# Patient Record
Sex: Male | Born: 1980 | Race: White | Hispanic: No | Marital: Single | Smoking: Never smoker
Health system: Southern US, Community
[De-identification: ages and names within clinical notes are randomized; demographics above are authoritative.]

---

## 2001-03-06 ENCOUNTER — Encounter: Payer: Self-pay | Admitting: Preventative Medicine

## 2001-03-06 ENCOUNTER — Encounter: Admission: RE | Admit: 2001-03-06 | Discharge: 2001-03-06 | Payer: Self-pay | Admitting: Preventative Medicine

## 2001-07-18 ENCOUNTER — Emergency Department (HOSPITAL_COMMUNITY): Admission: EM | Admit: 2001-07-18 | Discharge: 2001-07-19 | Payer: Self-pay | Admitting: *Deleted

## 2001-07-19 ENCOUNTER — Encounter: Payer: Self-pay | Admitting: *Deleted

## 2014-07-27 ENCOUNTER — Emergency Department (HOSPITAL_COMMUNITY): Payer: 59

## 2014-07-27 ENCOUNTER — Emergency Department (HOSPITAL_COMMUNITY)
Admission: EM | Admit: 2014-07-27 | Discharge: 2014-07-27 | Disposition: A | Discharge status: Home / Self Care | Payer: 59 | Attending: Emergency Medicine | Admitting: Emergency Medicine

## 2014-07-27 ENCOUNTER — Encounter (HOSPITAL_COMMUNITY): Payer: Self-pay | Admitting: Emergency Medicine

## 2014-07-27 DIAGNOSIS — R109 Unspecified abdominal pain: Secondary | ICD-10-CM | POA: Diagnosis not present

## 2014-07-27 LAB — CBC WITH DIFFERENTIAL/PLATELET
Basophils Absolute: 0 10*3/uL (ref 0.0–0.1)
Basophils Relative: 1 % (ref 0–1)
Eosinophils Absolute: 0.1 10*3/uL (ref 0.0–0.7)
Eosinophils Relative: 2 % (ref 0–5)
HCT: 41.8 % (ref 39.0–52.0)
Hemoglobin: 14.1 g/dL (ref 13.0–17.0)
Lymphocytes Relative: 39 % (ref 12–46)
Lymphs Abs: 2.7 10*3/uL (ref 0.7–4.0)
MCH: 29.9 pg (ref 26.0–34.0)
MCHC: 33.7 g/dL (ref 30.0–36.0)
MCV: 88.6 fL (ref 78.0–100.0)
Monocytes Absolute: 0.6 10*3/uL (ref 0.1–1.0)
Monocytes Relative: 8 % (ref 3–12)
Neutro Abs: 3.4 10*3/uL (ref 1.7–7.7)
Neutrophils Relative %: 50 % (ref 43–77)
Platelets: 210 10*3/uL (ref 150–400)
RBC: 4.72 MIL/uL (ref 4.22–5.81)
RDW: 11.8 % (ref 11.5–15.5)
WBC: 6.8 10*3/uL (ref 4.0–10.5)

## 2014-07-27 LAB — COMPREHENSIVE METABOLIC PANEL
ALT: 22 U/L (ref 0–53)
AST: 23 U/L (ref 0–37)
Albumin: 4 g/dL (ref 3.5–5.2)
Alkaline Phosphatase: 50 U/L (ref 39–117)
Anion gap: 10 (ref 5–15)
BUN: 23 mg/dL (ref 6–23)
CO2: 27 mEq/L (ref 19–32)
Calcium: 9.1 mg/dL (ref 8.4–10.5)
Chloride: 102 mEq/L (ref 96–112)
Creatinine, Ser: 1.26 mg/dL (ref 0.50–1.35)
GFR calc Af Amer: 86 mL/min — ABNORMAL LOW (ref >90)
GFR calc non Af Amer: 74 mL/min — ABNORMAL LOW (ref >90)
Glucose, Bld: 92 mg/dL (ref 70–99)
Potassium: 4.5 mEq/L (ref 3.7–5.3)
Sodium: 139 mEq/L (ref 137–147)
Total Bilirubin: 0.5 mg/dL (ref 0.3–1.2)
Total Protein: 6.9 g/dL (ref 6.0–8.3)

## 2014-07-27 LAB — LIPASE, BLOOD: Lipase: 36 U/L (ref 11–59)

## 2014-07-27 LAB — URINALYSIS, ROUTINE W REFLEX MICROSCOPIC
Bilirubin Urine: NEGATIVE
Glucose, UA: NEGATIVE mg/dL
Hgb urine dipstick: NEGATIVE
Ketones, ur: NEGATIVE mg/dL
Leukocytes, UA: NEGATIVE
Nitrite: NEGATIVE
Protein, ur: NEGATIVE mg/dL
Specific Gravity, Urine: 1.008 (ref 1.005–1.030)
Urobilinogen, UA: 0.2 mg/dL (ref 0.0–1.0)
pH: 6.5 (ref 5.0–8.0)

## 2014-07-27 MED ORDER — ONDANSETRON HCL 4 MG/2ML IJ SOLN
4.0000 mg | Freq: Once | INTRAMUSCULAR | Status: AC
  Administered 2014-07-27: 4 mg via INTRAVENOUS
  Filled 2014-07-27: qty 2

## 2014-07-27 MED ORDER — OMEPRAZOLE 20 MG PO CPDR: 20.0000 mg | DELAYED_RELEASE_CAPSULE | Freq: Every day | ORAL | Status: AC

## 2014-07-27 MED ORDER — OXYCODONE-ACETAMINOPHEN 5-325 MG PO TABS: 1.0000 | ORAL_TABLET | Freq: Four times a day (QID) | ORAL | Status: AC | PRN

## 2014-07-27 MED ORDER — HYDROMORPHONE HCL 1 MG/ML IJ SOLN
1.0000 mg | Freq: Once | INTRAMUSCULAR | Status: AC
  Administered 2014-07-27: 1 mg via INTRAVENOUS
  Filled 2014-07-27: qty 1

## 2014-07-27 MED ORDER — SODIUM CHLORIDE 0.9 % IV BOLUS (SEPSIS)
1000.0000 mL | INTRAVENOUS | Status: AC
  Administered 2014-07-27: 1000 mL via INTRAVENOUS

## 2014-07-27 NOTE — Discharge Instructions (Signed)

## 2014-07-27 NOTE — ED Notes (Signed)
The pt has had rt  Flank pain since this past Tuesday.  No blood in urine.  No n v or diarrhea

## 2014-07-27 NOTE — ED Provider Notes (Signed)
CSN: [id number]     Arrival date & time 07/27/14  2027 History   First MD Initiated Contact with Patient 07/27/14 2111     Chief Complaint  Patient presents with  . Flank Pain     (Consider location/radiation/quality/duration/timing/severity/associated sxs/prior Treatment) Patient is a 33 y.o. male presenting with flank pain. The history is provided by the patient.  Flank Pain This is a new problem. Episode onset: 5 days ago. The problem occurs constantly. The problem has been gradually worsening. Associated symptoms include abdominal pain. Pertinent negatives include no chest pain, no headaches and no shortness of breath. Nothing aggravates the symptoms. Relieved by: motrin. Treatments tried: motrin. The treatment provided mild relief.    History reviewed. No pertinent past medical history. History reviewed. No pertinent past surgical history. No family history on file. History  Substance Use Topics  . Smoking status: Never Smoker   . Smokeless tobacco: Not on file  . Alcohol Use: No    Review of Systems  Constitutional: Negative for fever.  HENT: Negative for drooling and rhinorrhea.   Eyes: Negative for pain.  Respiratory: Negative for cough and shortness of breath.   Cardiovascular: Negative for chest pain and leg swelling.  Gastrointestinal: Positive for abdominal pain. Negative for nausea, vomiting and diarrhea.  Genitourinary: Positive for flank pain. Negative for dysuria and hematuria.  Musculoskeletal: Negative for gait problem and neck pain.  Skin: Negative for color change.  Neurological: Negative for numbness and headaches.  Hematological: Negative for adenopathy.  Psychiatric/Behavioral: Negative for behavioral problems.  All other systems reviewed and are negative.     Allergies  Review of patient's allergies indicates no known allergies.  Home Medications   Prior to Admission medications   Not on File   BP 112/71  Pulse 57  Temp(Src) 98.4 F (36.9  C) (Oral)  Resp 18  Ht  (1.905 m)  Wt 225 lb (102.059 kg)  BMI 28.12 kg/m2  SpO2 100% Physical Exam  Nursing note and vitals reviewed. Constitutional: He is oriented to person, place, and time. He appears well-developed and well-nourished.  HENT:  Head: Normocephalic and atraumatic.  Right Ear: External ear normal.  Left Ear: External ear normal.  Nose: Nose normal.  Mouth/Throat: Oropharynx is clear and moist. No oropharyngeal exudate.  Eyes: Conjunctivae and EOM are normal. Pupils are equal, round, and reactive to light.  Neck: Normal range of motion. Neck supple.  Cardiovascular: Normal rate, regular rhythm, normal heart sounds and intact distal pulses.  Exam reveals no gallop and no friction rub.   No murmur heard. Pulmonary/Chest: Effort normal and breath sounds normal. No respiratory distress. He has no wheezes.  Abdominal: Soft. Bowel sounds are normal. He exhibits no distension. There is no tenderness. There is no rebound and no guarding.  Musculoskeletal: Normal range of motion. He exhibits no edema and no tenderness.  Neurological: He is alert and oriented to person, place, and time.  Skin: Skin is warm and dry.  Psychiatric: He has a normal mood and affect. His behavior is normal.    ED Course  Procedures (including critical care time) Labs Review Labs Reviewed  COMPREHENSIVE METABOLIC PANEL - Abnormal; Notable for the following:    GFR calc non Af Amer 74 (*)    GFR calc Af Amer 86 (*)    All other components within normal limits  URINALYSIS, ROUTINE W REFLEX MICROSCOPIC  CBC WITH DIFFERENTIAL  LIPASE, BLOOD    Imaging Review Ct Renal Stone Study  07/27/2014  CLINICAL DATA:  Right flank pain since Tuesday.  Initial encounter.  EXAM: CT ABDOMEN AND PELVIS WITHOUT CONTRAST  TECHNIQUE: Multidetector CT imaging of the abdomen and pelvis was performed following the standard protocol without IV contrast.  COMPARISON:  None.  FINDINGS: BODY WALL: Unremarkable.   LOWER CHEST: Unremarkable.  ABDOMEN/PELVIS:  Liver: No focal abnormality.  Biliary: No evidence of biliary obstruction or stone.  Pancreas: Unremarkable.  Spleen: Unremarkable.  Adrenals: Unremarkable.  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 No hydronephrosis or ureteral calculus.  Bladder: Unremarkable.  Reproductive: Unremarkable.  Bowel: No obstruction. Subhepatic appendix, negative for appendicitis.  Retroperitoneum: No mass or adenopathy.  Peritoneum: No ascites or pneumoperitoneum.  Vascular: No acute abnormality.  OSSEOUS: No acute abnormalities.  IMPRESSION: No findings to explain right flank pain.   Electronically Signed   By: Tiburcio Pea M.D.   On: 07/27/2014 22:32     EKG Interpretation None      MDM   Final diagnoses:  Right flank pain    9:37 PM 33 y.o. male who presents with right flank pain which began gradually 5 days ago and has worsened since then. He denies nausea, vomiting, diarrhea, or fever. He states that his symptoms seem to be helped with food instead of worsened. His abdomen is benign and although he points to the right upper quadrant and right flank he has no focal tenderness. Will get screening labs and imaging. Dilaudid for pain control.  10:51 PM: I interpreted/reviewed the labs and/or imaging which were non-contributory.   I have discussed the diagnosis/risks/treatment options with the patient and believe the pt to be eligible for discharge home to follow-up with his pcp tomorrow. We also discussed returning to the ED immediately if new or worsening sx occur. We discussed the sx which are most concerning (e.g., worsening pain, fever, vomiting) that necessitate immediate return. Medications administered to the patient during their visit and any new prescriptions provided to the patient are listed below.  Medications given during this visit Medications  sodium chloride 0.9 % bolus 1,000 mL (not administered)   HYDROmorphone (DILAUDID) injection 1 mg (1 mg Intravenous Given 07/27/14 2201)  ondansetron (ZOFRAN) injection 4 mg (4 mg Intravenous Given 07/27/14 2200)    New Prescriptions   OMEPRAZOLE (PRILOSEC) 20 MG CAPSULE    Take 1 capsule (20 mg total) by mouth daily.   OXYCODONE-ACETAMINOPHEN (PERCOCET) 5-325 MG PER TABLET    Take 1-2 tablets by mouth every 6 (six) hours as needed for moderate pain.     Purvis Sheffield, MD 07/27/14 2316

## 2015-10-25 IMAGING — CT CT RENAL STONE PROTOCOL
1 of 5 series · 5 of 46 positions shown, 6 images · non-contrast
Comparison: None.

CLINICAL DATA: Right flank pain since [REDACTED].  Initial encounter.

EXAM:
CT ABDOMEN AND PELVIS WITHOUT CONTRAST
TECHNIQUE: Multidetector CT imaging of the abdomen and pelvis was performed
following the standard protocol without IV contrast.

[Series 206: coronals · coronal · 0.50mm/px · 5 of 93 slices shown, 6 images]
[im 11/93  soft-tissue]
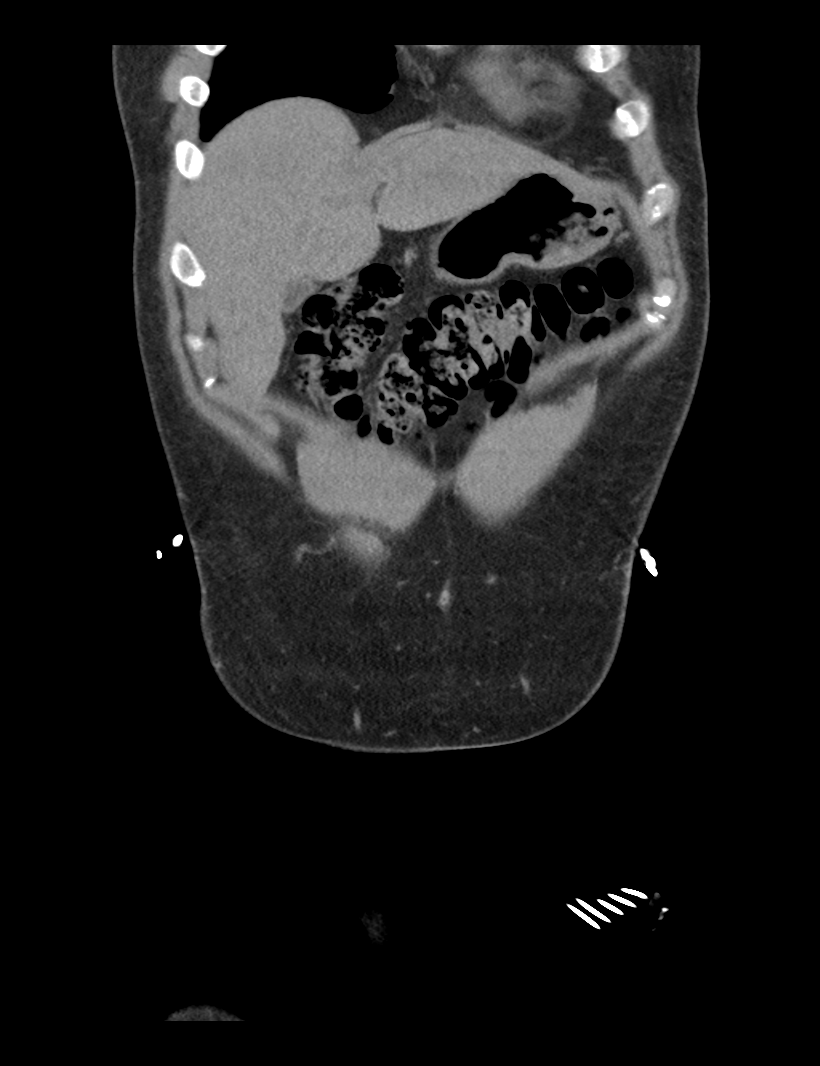
[im 11/93  bone]
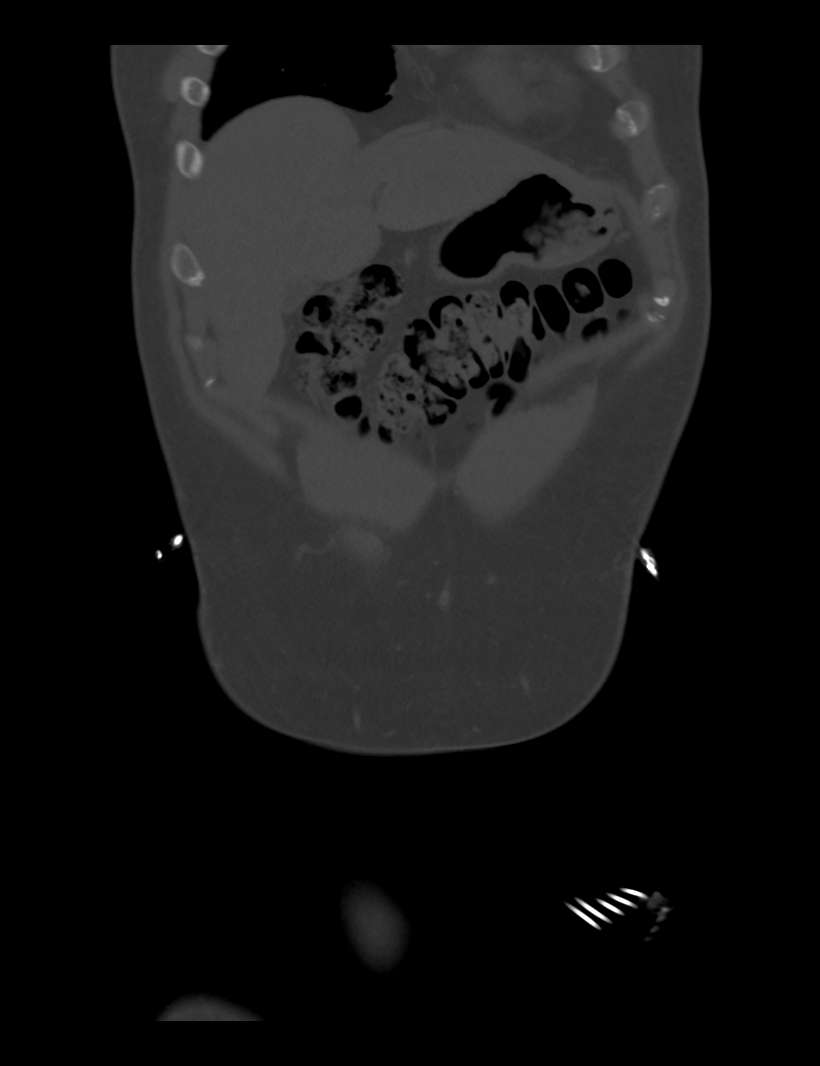
[im 31/93  soft-tissue]
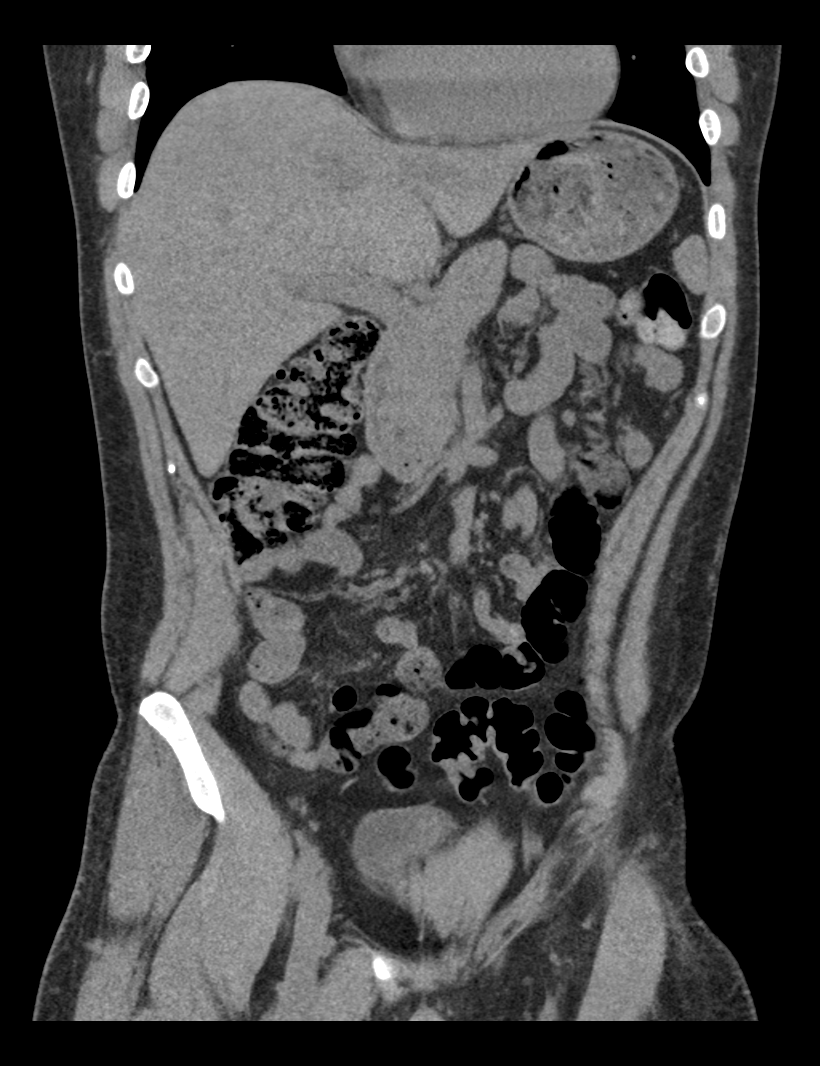
[im 52/93  soft-tissue]
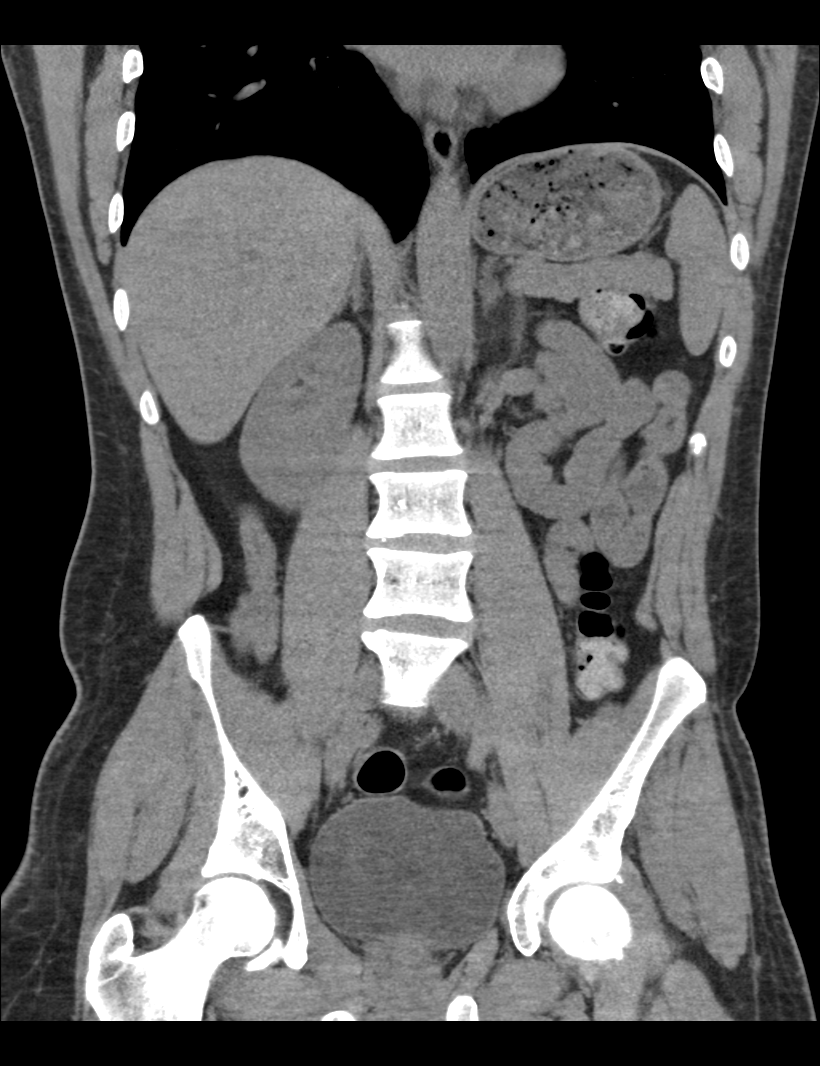
[im 62/93  soft-tissue]
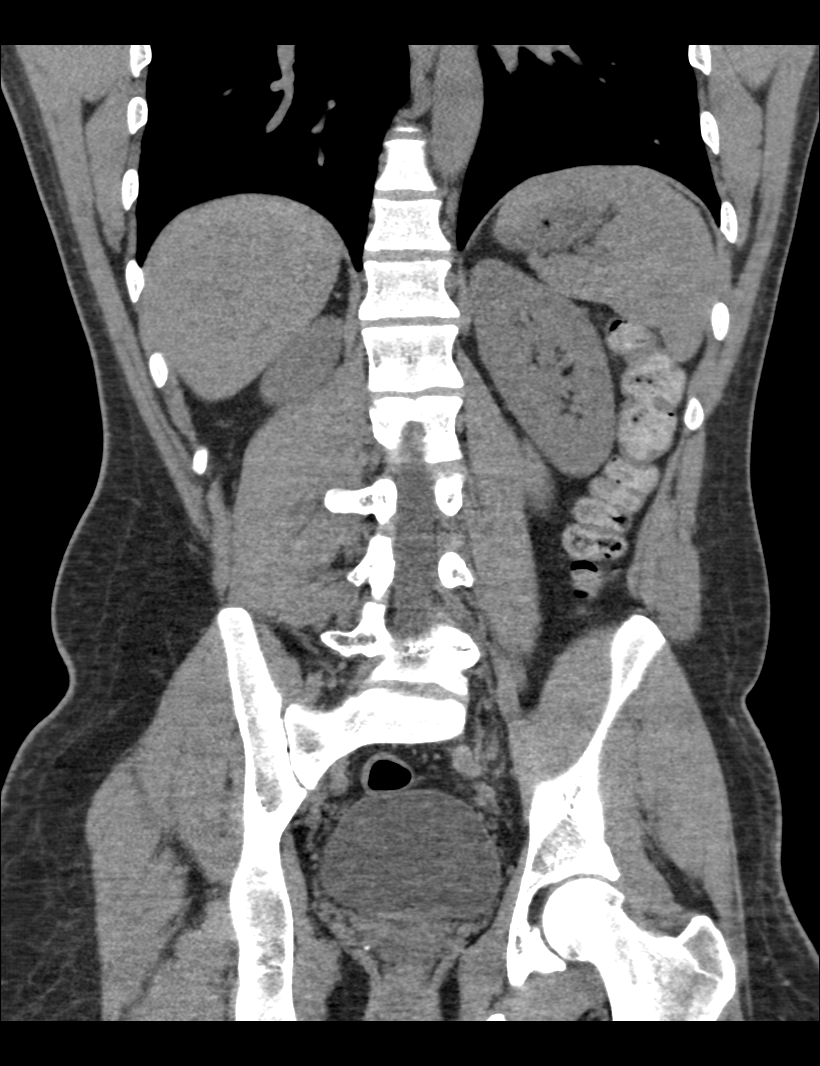
[im 82/93  soft-tissue]
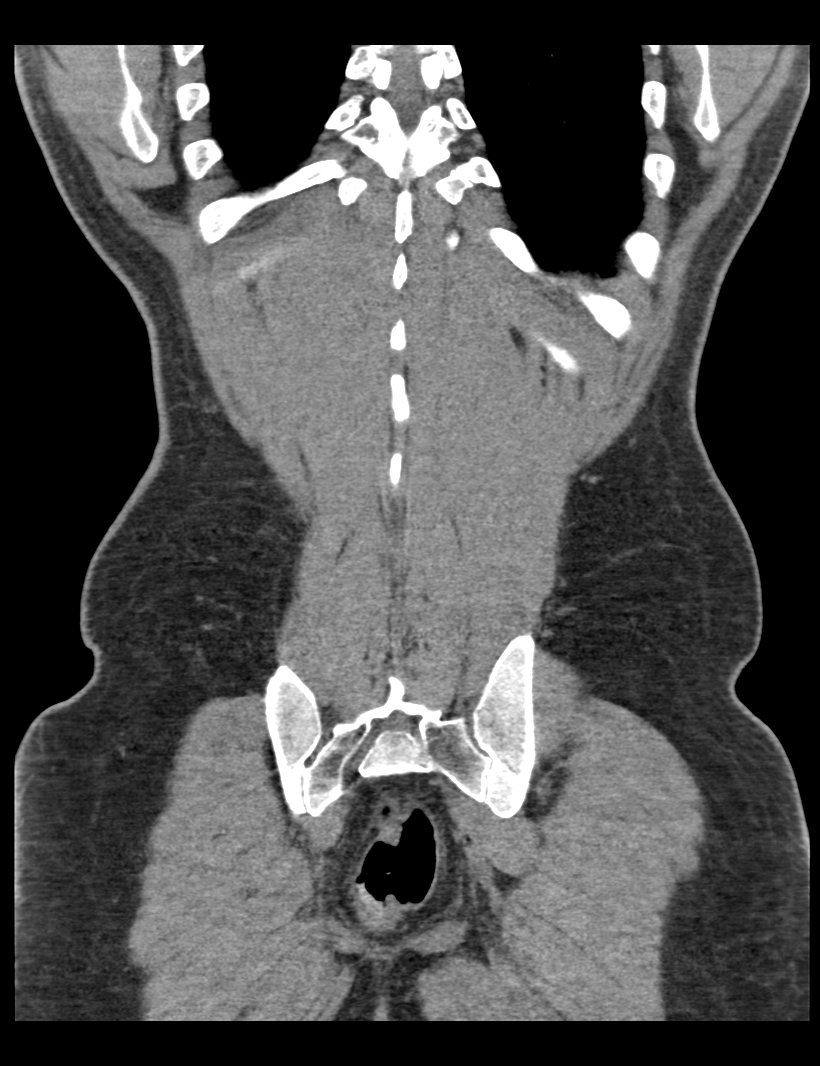

[5 of 46 positions shown; findings below may reference images not displayed]

FINDINGS: BODY WALL: Unremarkable.

LOWER CHEST: Unremarkable.

ABDOMEN/PELVIS:

Liver: No focal abnormality.

Biliary: No evidence of biliary obstruction or stone.

Pancreas: Unremarkable.

Spleen: Unremarkable.

Adrenals: Unremarkable.

Kidneys and ureters: Mild malrotation of the right kidney with low
accessory renal vasculature. No hydronephrosis or ureteral calculus.

Bladder: Unremarkable.

Reproductive: Unremarkable.

Bowel: No obstruction. Subhepatic appendix, negative for
appendicitis.

Retroperitoneum: No mass or adenopathy.

Peritoneum: No ascites or pneumoperitoneum.

Vascular: No acute abnormality.

OSSEOUS: No acute abnormalities.
IMPRESSION: No findings to explain right flank pain.

## 2022-05-21 ENCOUNTER — Emergency Department (HOSPITAL_COMMUNITY)
Admission: EM | Admit: 2022-05-21 | Discharge: 2022-05-22 | Disposition: A | Discharge status: Home / Self Care | Payer: Self-pay | Attending: Emergency Medicine | Admitting: Emergency Medicine

## 2022-05-21 ENCOUNTER — Emergency Department (HOSPITAL_COMMUNITY): Payer: Self-pay

## 2022-05-21 ENCOUNTER — Encounter (HOSPITAL_COMMUNITY): Payer: Self-pay

## 2022-05-21 DIAGNOSIS — R079 Chest pain, unspecified: Secondary | ICD-10-CM | POA: Insufficient documentation

## 2022-05-21 DIAGNOSIS — R519 Headache, unspecified: Secondary | ICD-10-CM | POA: Insufficient documentation

## 2022-05-21 DIAGNOSIS — R202 Paresthesia of skin: Secondary | ICD-10-CM | POA: Insufficient documentation

## 2022-05-21 LAB — CBC
HCT: 48.6 % (ref 39.0–52.0)
Hemoglobin: 16.5 g/dL (ref 13.0–17.0)
MCH: 31.1 pg (ref 26.0–34.0)
MCHC: 34 g/dL (ref 30.0–36.0)
MCV: 91.5 fL (ref 80.0–100.0)
Platelets: 269 10*3/uL (ref 150–400)
RBC: 5.31 MIL/uL (ref 4.22–5.81)
RDW: 11.9 % (ref 11.5–15.5)
WBC: 8.5 10*3/uL (ref 4.0–10.5)
nRBC: 0 % (ref 0.0–0.2)

## 2022-05-21 LAB — BASIC METABOLIC PANEL
Anion gap: 8 (ref 5–15)
BUN: 21 mg/dL — ABNORMAL HIGH (ref 6–20)
CO2: 24 mmol/L (ref 22–32)
Calcium: 10 mg/dL (ref 8.9–10.3)
Chloride: 109 mmol/L (ref 98–111)
Creatinine, Ser: 1.13 mg/dL (ref 0.61–1.24)
GFR, Estimated: >60 mL/min (ref >60)
Glucose, Bld: 102 mg/dL — ABNORMAL HIGH (ref 70–99)
Potassium: 4.2 mmol/L (ref 3.5–5.1)
Sodium: 141 mmol/L (ref 135–145)

## 2022-05-21 LAB — TROPONIN I (HIGH SENSITIVITY): Troponin I (High Sensitivity): <2 ng/L (ref <18)

## 2022-05-21 MED ORDER — PROCHLORPERAZINE MALEATE 10 MG PO TABS
10.0000 mg | ORAL_TABLET | Freq: Once | ORAL | Status: AC
  Administered 2022-05-21: 10 mg via ORAL
  Filled 2022-05-21: qty 1

## 2022-05-21 MED ORDER — SODIUM CHLORIDE 0.9 % IV BOLUS: 1000.0000 mL | Freq: Once | INTRAVENOUS | Status: DC

## 2022-05-21 MED ORDER — KETOROLAC TROMETHAMINE 15 MG/ML IJ SOLN: 15.0000 mg | Freq: Once | INTRAMUSCULAR | Status: DC

## 2022-05-21 MED ORDER — DIPHENHYDRAMINE HCL 50 MG/ML IJ SOLN: 25.0000 mg | Freq: Once | INTRAMUSCULAR | Status: DC

## 2022-05-21 MED ORDER — PROCHLORPERAZINE EDISYLATE 10 MG/2ML IJ SOLN: 10.0000 mg | Freq: Once | INTRAMUSCULAR | Status: DC

## 2022-05-21 MED ORDER — NAPROXEN 500 MG PO TABS
500.0000 mg | ORAL_TABLET | Freq: Once | ORAL | Status: AC
  Administered 2022-05-21: 500 mg via ORAL
  Filled 2022-05-21: qty 1

## 2022-05-21 NOTE — ED Provider Triage Note (Signed)
Emergency Medicine Provider Triage Evaluation Note  Wesley Allen , a 41 y.o. male  was evaluated in triage.  Pt complains of multiple complaints.  Patient states he had a episode of headache, left-sided diffuse body numbness on Monday.  The patient states on Tuesday he had a nosebleed.  Patient states on Wednesday he began experiencing chest pain that is continued into today.  The patient states that the chest pain is located centrally, does not radiate, is worse with exertion and is associated with shortness of breath.  Patient states that he has not seen a doctor in "since I was a little boy".  The patient states he has a family history of brain tumors on uncle side.  Patient reports that he has been having increasing headaches and migraines described as a pressure, debilitating for the last 3 years.  Review of Systems  Positive:  Negative:   Physical Exam  BP (!) 146/98   Pulse 98   Resp 18   Ht 6\' 3"  (1.905 m)   Wt 113.4 kg   SpO2 100%   BMI 31.25 kg/m  Gen:   Awake, no distress   Resp:  Normal effort  MSK:   Moves extremities without difficulty  Other:    Medical Decision Making  Medically screening exam initiated at 10:34 PM.  Appropriate orders placed.  Rosco Harriott was informed that the remainder of the evaluation will be completed by another provider, this initial triage assessment does not replace that evaluation, and the importance of remaining in the ED until their evaluation is complete.     Dub Mikes, PA-C 05/21/22 2236

## 2022-05-21 NOTE — ED Triage Notes (Signed)
Pt reports CP and left sided numbness to face, arm, and LE that started on Monday, nose bleed on Tuesday, also reports pressure to upper abd the past few days. CP and tightness started again today around lunch time while walking, also reports SOB. Reports "felt like ice going through my veins" on left arm. LLE edema noted. No cardiac hx. A&O x4, NAD noted at current.

## 2022-05-21 NOTE — ED Notes (Signed)
Patient transported to CT 

## 2022-05-22 MED ORDER — PROCHLORPERAZINE MALEATE 10 MG PO TABS: 10.0000 mg | ORAL_TABLET | Freq: Three times a day (TID) | ORAL | 0 refills | Status: AC | PRN

## 2022-05-22 NOTE — ED Provider Notes (Signed)
[facility]-EMERGENCY DEPT [facility] Emergency Department Provider Note MRN:  [id number]  Arrival date & time: 05/22/22     Chief Complaint   Headaches History of Present Illness   Wesley Allen is a 41 y.o. year-old male with no pertinent past medical history presenting to the ED with chief complaint of headaches.  Patient has been having headaches for the past year or more.  Seems to be happening more frequently, more constant.  Long history of migraines but the type of pain seems to be different.  More of an internal pressure rather than a tension.  Has been having some intermittent paresthesia to the left arm for the past few days.  Had some chest pressure earlier today as well.  No nausea vomiting, no dizziness or diaphoresis, no shortness of breath, no abdominal pain, no numbness or weakness to the arms or legs.  Patient explains that when he feels strong emotions, rather than expressing emotions he tends to get headaches.  Mother passed away about a year ago.  Review of Systems  A thorough review of systems was obtained and all systems are negative except as noted in the HPI and PMH.   Patient's Health History   History reviewed. No pertinent past medical history.  History reviewed. No pertinent surgical history.  No family history on file.  Social History   Socioeconomic History   Marital status: Married    Spouse name: Not on file   Number of children: Not on file   Years of education: Not on file   Highest education level: Not on file  Occupational History   Not on file  Tobacco Use   Smoking status: Never   Smokeless tobacco: Not on file  Vaping Use   Vaping Use: Never used  Substance and Sexual Activity   Alcohol use: No   Drug use: Never   Sexual activity: Not Currently  Other Topics Concern   Not on file  Social History Narrative   Not on file   Social Determinants of Health   Financial Resource Strain: Not on file  Food Insecurity: Not on file   Transportation Needs: Not on file  Physical Activity: Not on file  Stress: Not on file  Social Connections: Not on file  Intimate Partner Violence: Not on file     Physical Exam   Vitals:   05/21/22 2210 05/21/22 2345  BP: (!) 146/98 (!) 153/90  Pulse: 98 74  Resp: 18 16  SpO2: 100% 96%    CONSTITUTIONAL: Well-appearing, NAD NEURO/PSYCH:  Alert and oriented x 3, normal and symmetric strength and sensation, normal coordination, normal speech EYES:  eyes equal and reactive ENT/NECK:  no LAD, no JVD CARDIO: Regular rate, well-perfused, normal S1 and S2 PULM:  CTAB no wheezing or rhonchi GI/GU:  non-distended, non-tender MSK/SPINE:  No gross deformities, no edema SKIN:  no rash, atraumatic   *Additional and/or pertinent findings included in MDM below  Diagnostic and Interventional Summary    EKG Interpretation  Date/Time:  Saturday May 21 2022 22:45:28 EDT Ventricular Rate:  79 PR Interval:  183 QRS Duration: 92 QT Interval:  364 QTC Calculation: 418 R Axis:   -7 Text Interpretation: Sinus rhythm Abnormal R-wave progression, early transition No previous ECGs available Confirmed by Kennis Carina [phone]) on 05/21/2022 11:03:05 PM       Labs Reviewed  BASIC METABOLIC PANEL - Abnormal; Notable for the following components:      Result Value   Glucose, Bld 102 (*)  BUN 21 (*)    All other components within normal limits  CBC  TROPONIN I (HIGH SENSITIVITY)  TROPONIN I (HIGH SENSITIVITY)    DG Chest 2 View  Final Result    CT Head Wo Contrast  Final Result      Medications  prochlorperazine (COMPAZINE) tablet 10 mg (10 mg Oral Given 05/21/22 2358)  naproxen (NAPROSYN) tablet 500 mg (500 mg Oral Given 05/21/22 2357)     Procedures  /  Critical Care Procedures  ED Course and Medical Decision Making  Initial Impression and Ddx Patient here mostly to discuss his worsening headaches.  With the new paresthesias over the past several days there is suspicion for  complex migraines.  Other considerations would be mass occupying lesion of the brain.  Very reassuring neurological exam, highly doubt stroke or other vascular process.  The chest pain was fleeting in nature, mild, he does not have strokelike symptoms on exam, dissection is felt to be extremely unlikely.  Vital signs normal, EKG reassuring, awaiting labs, CT head.  Past medical/surgical history that increases complexity of ED encounter: None  Interpretation of Diagnostics I personally reviewed the EKG and my interpretation is as follows: Sinus rhythm  Labs reassuring with no significant blood count or electrolyte disturbance, troponin is negative.  CT head is normal.  Patient Reassessment and Ultimate Disposition/Management     Patient provided with reassurance, nothing to suggest emergent process at this time, referred to neurology.  Patient management required discussion with the following services or consulting groups:  None  Complexity of Problems Addressed Acute illness or injury that poses threat of life of bodily function  Additional Data Reviewed and Analyzed Further history obtained from: Further history from spouse/family member  Additional Factors Impacting ED Encounter Risk Prescriptions  Elmer Sow. Pilar Plate, MD [facility] Emergency Medicine [facility] [email or web address]  Final Clinical Impressions(s) / ED Diagnoses     ICD-10-CM   1. Headache disorder  R51.9     2. Chest pain, unspecified type  R07.9     3. Paresthesia  R20.2       ED Discharge Orders          Ordered    Ambulatory referral to Neurology       Comments: An appointment is requested in approximately: 1 week   05/22/22 0022    prochlorperazine (COMPAZINE) 10 MG tablet  Every 8 hours PRN        05/22/22 0023             Discharge Instructions Discussed with and Provided to Patient:    Discharge Instructions      You were evaluated in the Emergency Department and  after careful evaluation, we did not find any emergent condition requiring admission or further testing in the hospital.  Your exam/testing today was overall reassuring.  Symptoms seem to be related to a headache disorder.  Your CT scan did not show any brain abnormalities, your labs were normal.  Recommend follow-up with a neurologist to further discuss your headaches.  Also recommend follow-up with a primary care doctor.  Recommend using [email or web address] to find the earliest primary care appointment.  Can use the Compazine as needed for headaches.  Please return to the Emergency Department if you experience any worsening of your condition.  Thank you for allowing [place] to be a part of your care.       Sabas Sous, MD 05/22/22 0030

## 2022-05-22 NOTE — Discharge Instructions (Addendum)
You were evaluated in the Emergency Department and after careful evaluation, we did not find any emergent condition requiring admission or further testing in the hospital.  Your exam/testing today was overall reassuring.  Symptoms seem to be related to a headache disorder.  Your CT scan did not show any brain abnormalities, your labs were normal.  Recommend follow-up with a neurologist to further discuss your headaches.  Also recommend follow-up with a primary care doctor.  Recommend using [email or web address] to find the earliest primary care appointment.  Can use the Compazine as needed for headaches.  Please return to the Emergency Department if you experience any worsening of your condition.  Thank you for allowing [place] to be a part of your care.

## 2022-05-22 NOTE — ED Notes (Signed)
Patient reports improvement after medications.

## 2022-06-02 ENCOUNTER — Telehealth: Payer: Self-pay | Admitting: Psychiatry

## 2022-06-02 ENCOUNTER — Encounter: Payer: Self-pay | Admitting: Psychiatry

## 2022-06-02 ENCOUNTER — Ambulatory Visit (INDEPENDENT_AMBULATORY_CARE_PROVIDER_SITE_OTHER): Payer: Self-pay | Admitting: Psychiatry

## 2022-06-02 VITALS — BP 128/80 | HR 91 | Ht 75.5 in | Wt 275.4 lb

## 2022-06-02 DIAGNOSIS — R29818 Other symptoms and signs involving the nervous system: Secondary | ICD-10-CM

## 2022-06-02 DIAGNOSIS — G43109 Migraine with aura, not intractable, without status migrainosus: Secondary | ICD-10-CM

## 2022-06-02 DIAGNOSIS — R519 Headache, unspecified: Secondary | ICD-10-CM

## 2022-06-02 NOTE — Telephone Encounter (Signed)
Correction-MRI was sent to [facility], patient requested a [facility] facility.

## 2022-06-02 NOTE — Patient Instructions (Addendum)
Recommend brain MRI with contrast  [facility] [employer] [facility] at [phone]   Migraine Aura Migraines are moderate to severe headaches which may have throbbing pain, sensitivity to light/sound, and nausea. They typically last 3 hours or more at a time.  Migraines can be preceded by neurological symptoms called auras. Auras can have many different symptoms including vision changes (zigzags, kaleidoscope vision, flashing lights, blurred vision), numbness and tingling (usually on the arm or face), difficulty with language (reading or speaking), and weakness on one side. Many times these symptoms will occur before a headache, but auras can occur without a headache as well. Auras can naturally change over time and may convert from one type to another (people with visual aura may eventually develop a sensory aura, etc).   Natural supplements that can reduce migraines: Magnesium Oxide or Magnesium Glycinate 500 mg at bed (up to 800 mg daily) Coenzyme Q10 300 mg in AM Vitamin B2- 200 mg twice a day  Add 1 supplement at a time since even natural supplements can have undesirable side effects. You can sometimes buy supplements cheaper (especially Coenzyme Q10) at www.[employer].co.za or at [employer].  Magnesium: Magnesium (250 mg twice a day or 500 mg at bed) has a relaxant effect on smooth muscles such as blood vessels. Individuals suffering from frequent or daily headache usually have low magnesium levels which can be increase with daily supplementation of 400-800 mg. Three trials found 40-90% average headache reduction  when used as a preventative. Magnesium also demonstrated the benefit in menstrually related migraine.  Magnesium is part of the messenger system in the serotonin cascade and it is a good muscle relaxant.  It is also useful for constipation. Good sources include nuts, whole grains, and tomatoes. Side Effects: loose stool/diarrhea  Riboflavin (vitamin B 2) 200 mg twice a day. This  vitamin assists nerve cells in the production of ATP a principal energy storing molecule.  It is necessary for many chemical reactions in the body.  There have been at least 3 clinical trials of riboflavin using 400 mg per day all of which suggested that migraine frequency can be decreased.  All 3 trials showed significant improvement in over half of migraine sufferers.  The supplement is found in bread, cereal, milk, meat, and poultry.  Most Americans get more riboflavin than the recommended daily allowance, however riboflavin deficiency is not necessary for the supplements to help prevent headache. Side effects: energizing, green urine  Coenzyme Q10: This is present in almost all cells in the body and is critical component for the conversion of energy.  Recent studies have shown that a nutritional supplement of CoQ10 can reduce the frequency of migraine attacks by improving the energy production of cells as with riboflavin.  Doses of 150 mg twice a day have been shown to be effective.

## 2022-06-02 NOTE — Telephone Encounter (Signed)
Self-pay sent to [facility]

## 2022-06-02 NOTE — Progress Notes (Signed)
[facility]  PATIENT: Wesley Allen DOB: [birth year]  REFERRING CLINICIAN: Sabas Sous, MD HISTORY FROM: self REASON FOR VISIT: headache, numbness   HISTORICAL  CHIEF COMPLAINT:  Chief Complaint  Patient presents with   New Patient (Initial Visit)    Pt is well. He states he has been feeling numbness in head Monday-Tuesday. He reports feeling pressure in hs head as well. He states he also has been feeling numbness on whole left side. Room 8 alone    HISTORY OF PRESENT ILLNESS:  The patient presents for evaluation of headaches and numbness. He has a history of migraines but started to develop frequent headaches over the past 2 years. They have been worsening in intensity and frequency. Feels like he has constant pressure starting from the center of his head pressing outward. He will also have pain behind his eyes or in the back of his head. Pain can be dull or throbbing. Severity will fluctuate. He started to get tingling and an electrical feeling over his vertex. Feels like bubbles are moving around his head or he will have a cold sensation over his face. Headaches are not associated with photophobia, phonophobia, or nausea. Sometimes will get tunnel vision. They are severe enough that they are debilitating and will drain him of energy. Severe headaches can last up to 3 hours at a time. No clear triggers for his headaches though he has a lot of stress in his life.  Thought this may be psychological so he went to behavioral health for a year, but this did not improve his headache. Notes he did lose his mother a year ago.  Earlier this month  he woke up and felt decreased sensation in his left arm which then spread to his left leg. Numbness gradually subsided. He did not have any symptoms for the next few days, then was hiking in the heat and developed shortness of breath and chest tightness. Then his left arm went numb for a second time. He presented to the ED 05/21/22  where cardiac workup and CTH were unremarkable.  He did have another episode of numbness 3 days ago. This time felt like his entire head was going numb. He developed a headache after the numbness wore off.   OTHER MEDICAL CONDITIONS: migraines   REVIEW OF SYSTEMS: Full 14 system review of systems performed and negative with exception of: headaches, numbness  ALLERGIES: No Known Allergies  HOME MEDICATIONS: Outpatient Medications Prior to Visit  Medication Sig Dispense Refill   ibuprofen (ADVIL,MOTRIN) 200 MG tablet Take 800 mg by mouth every 6 (six) hours as needed for moderate pain. (Patient not taking: Reported on 06/02/2022)     omeprazole (PRILOSEC) 20 MG capsule Take 1 capsule (20 mg total) by mouth daily. (Patient not taking: Reported on 05/21/2022) 30 capsule 0   oxyCODONE-acetaminophen (PERCOCET) 5-325 MG per tablet Take 1-2 tablets by mouth every 6 (six) hours as needed for moderate pain. (Patient not taking: Reported on 05/21/2022) 20 tablet 0   prochlorperazine (COMPAZINE) 10 MG tablet Take 1 tablet (10 mg total) by mouth every 8 (eight) hours as needed (Nausea or headache). (Patient not taking: Reported on 06/02/2022) 20 tablet 0   No facility-administered medications prior to visit.    PAST MEDICAL HISTORY: History reviewed. No pertinent past medical history.  PAST SURGICAL HISTORY: History reviewed. No pertinent surgical history.  FAMILY HISTORY: Paternal uncle had a brain tumor.  SOCIAL HISTORY: Social History   Socioeconomic History   Marital status:  Single    Spouse name: Not on file   Number of children: Not on file   Years of education: Not on file   Highest education level: Not on file  Occupational History   Not on file  Tobacco Use   Smoking status: Never   Smokeless tobacco: Not on file  Vaping Use   Vaping Use: Never used  Substance and Sexual Activity   Alcohol use: No   Drug use: Never   Sexual activity: Not Currently  Other Topics Concern    Not on file  Social History Narrative   Not on file   Social Determinants of Health   Financial Resource Strain: Not on file  Food Insecurity: Not on file  Transportation Needs: Not on file  Physical Activity: Not on file  Stress: Not on file  Social Connections: Not on file  Intimate Partner Violence: Not on file     PHYSICAL EXAM   GENERAL EXAM/CONSTITUTIONAL: Vitals:  Vitals:   06/02/22 0914  BP: 128/80  Pulse: 91  Weight: 275 lb 6 oz (124.9 kg)  Height: 6' 3.5" (1.918 m)   Body mass index is 33.97 kg/m. Wt Readings from Last 3 Encounters:  06/02/22 275 lb 6 oz (124.9 kg)  05/21/22 250 lb (113.4 kg)  07/27/14 225 lb (102.1 kg)    NEUROLOGIC: MENTAL STATUS:  awake, alert, oriented to person, place and time recent and remote memory intact normal attention and concentration language fluent, comprehension intact, naming intact fund of knowledge appropriate  CRANIAL NERVE:  2nd, 3rd, 4th, 6th - pupils equal and reactive to light, visual fields full to confrontation, extraocular muscles intact, no nystagmus 5th - facial sensation symmetric 7th - facial strength symmetric 8th - hearing intact 9th - palate elevates symmetrically, uvula midline 11th - shoulder shrug symmetric 12th - tongue protrusion midline  MOTOR:  normal bulk and tone, full strength in the BUE, BLE  SENSORY:  normal and symmetric to light touch, pinprick, temperature, vibration  COORDINATION:  finger-nose-finger, fine finger movements normal  REFLEXES:  deep tendon reflexes present and symmetric  GAIT/STATION:  normal     DIAGNOSTIC DATA (LABS, IMAGING, TESTING) - I reviewed patient records, labs, notes, testing and imaging myself where available.  Lab Results  Component Value Date   WBC 8.5 05/21/2022   HGB 16.5 05/21/2022   HCT 48.6 05/21/2022   MCV 91.5 05/21/2022   PLT 269 05/21/2022      Component Value Date/Time   NA 141 05/21/2022 2220   K 4.2 05/21/2022 2220    CL 109 05/21/2022 2220   CO2 24 05/21/2022 2220   GLUCOSE 102 (H) 05/21/2022 2220   BUN 21 (H) 05/21/2022 2220   CREATININE 1.13 05/21/2022 2220   CALCIUM 10.0 05/21/2022 2220   PROT 6.9 07/27/2014 2132   ALBUMIN 4.0 07/27/2014 2132   AST 23 07/27/2014 2132   ALT 22 07/27/2014 2132   ALKPHOS 50 07/27/2014 2132   BILITOT 0.5 07/27/2014 2132   GFRNONAA >60 05/21/2022 2220   GFRAA 86 (L) 07/27/2014 2132   CTH 05/21/22: unremarkable    ASSESSMENT AND PLAN  41 y.o. year old male with a history of migraines who presents for evaluation of daily headaches and new left sided numbness. Will order MRI brain as this is a change in his previous headache pattern. 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 He prefers to avoid medications if possible. Provided supplement information  for migraine and aura prevention.   1. Headache with neurologic deficit     PLAN: -MRI brain  -Supplement information provided (Mg, B2, CoQ10) -next steps: consider treating for migraine if MRI normal  Orders Placed This Encounter  Procedures   MR BRAIN W WO CONTRAST    No orders of the defined types were placed in this encounter.   Return in about 6 months (around 12/03/2022).    Ocie Doyne, MD 06/02/22 10:32 AM  I spent an average of 62 chart minutes reviewing and counseling the patient, with at least 50% of the time face to face with the patient.   [facility] [address], [address] [place], [place] [address] [phone]

## 2022-06-15 ENCOUNTER — Ambulatory Visit (HOSPITAL_COMMUNITY): Payer: Self-pay

## 2022-08-29 ENCOUNTER — Ambulatory Visit (HOSPITAL_COMMUNITY): Payer: Self-pay

## 2022-10-17 ENCOUNTER — Ambulatory Visit (HOSPITAL_COMMUNITY): Payer: Self-pay

## 2022-11-12 ENCOUNTER — Encounter (HOSPITAL_COMMUNITY): Payer: Self-pay

## 2022-11-12 ENCOUNTER — Ambulatory Visit (HOSPITAL_COMMUNITY): Admission: RE | Admit: 2022-11-12 | Payer: PRIVATE HEALTH INSURANCE | Source: Ambulatory Visit

## 2022-11-26 ENCOUNTER — Ambulatory Visit (HOSPITAL_COMMUNITY)
Admission: RE | Admit: 2022-11-26 | Discharge: 2022-11-26 | Disposition: A | Discharge status: Home / Self Care | Payer: BC Managed Care – PPO | Source: Ambulatory Visit | Attending: Psychiatry | Admitting: Psychiatry

## 2022-11-26 DIAGNOSIS — R29818 Other symptoms and signs involving the nervous system: Secondary | ICD-10-CM | POA: Insufficient documentation

## 2022-11-26 DIAGNOSIS — R519 Headache, unspecified: Secondary | ICD-10-CM | POA: Insufficient documentation

## 2022-11-26 MED ORDER — GADOBUTROL 1 MMOL/ML IV SOLN
10.0000 mL | Freq: Once | INTRAVENOUS | Status: AC | PRN
  Administered 2022-11-26: 10 mL via INTRAVENOUS

## 2022-12-07 NOTE — Progress Notes (Unsigned)
[facility]  PATIENT: Wesley Allen DOB: May 28, 1981  REFERRING CLINICIAN: No ref. provider found HISTORY FROM: self REASON FOR VISIT: headache, numbness   HISTORICAL  CHIEF COMPLAINT:  No chief complaint on file.  Follow-up visit:  Prior visit: 06/02/2022 with Dr. Billey Gosling (initial visit)   Brief HPI:   Wesley Allen is a 42 y.o. male who is being seen for follow-up regarding frequent headaches as well as transient episodes of left-sided numbness associated with headache. 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 MRI brain negative.    Interval history:    The patient presents for evaluation of headaches and numbness. He has a history of migraines but started to develop frequent headaches over the past 2 years. They have been worsening in intensity and frequency. Feels like he has constant pressure starting from the center of his head pressing outward. He will also have pain behind his eyes or in the back of his head. Pain can be dull or throbbing. Severity will fluctuate. He started to get tingling and an electrical feeling over his vertex. Feels like bubbles are moving around his head or he will have a cold sensation over his face. Headaches are not associated with photophobia, phonophobia, or nausea. Sometimes will get tunnel vision. They are severe enough that they are debilitating and will drain him of energy. Severe headaches can last up to 3 hours at a time. No clear triggers for his headaches though he has a lot of stress in his life.  Thought this may be psychological so he went to behavioral health for a year, but this did not improve his headache. Notes he did lose his mother a year ago.  Earlier this month  he woke up and felt decreased sensation in his left arm which then spread to his left leg. Numbness gradually subsided. He did not have any symptoms for the next few days, then was hiking in the heat and developed shortness of breath and  chest tightness. Then his left arm went numb for a second time. He presented to the ED 05/21/22 where cardiac workup and CTH were unremarkable.  He did have another episode of numbness 3 days ago. This time felt like his entire head was going numb. He developed a headache after the numbness wore off.   OTHER MEDICAL CONDITIONS: migraines   REVIEW OF SYSTEMS: Full 14 system review of systems performed and negative with exception of: headaches, numbness  ALLERGIES: No Known Allergies  HOME MEDICATIONS: Outpatient Medications Prior to Visit  Medication Sig Dispense Refill   ibuprofen (ADVIL,MOTRIN) 200 MG tablet Take 800 mg by mouth every 6 (six) hours as needed for moderate pain. (Patient not taking: Reported on 06/02/2022)     omeprazole (PRILOSEC) 20 MG capsule Take 1 capsule (20 mg total) by mouth daily. (Patient not taking: Reported on 05/21/2022) 30 capsule 0   oxyCODONE-acetaminophen (PERCOCET) 5-325 MG per tablet Take 1-2 tablets by mouth every 6 (six) hours as needed for moderate pain. (Patient not taking: Reported on 05/21/2022) 20 tablet 0   prochlorperazine (COMPAZINE) 10 MG tablet Take 1 tablet (10 mg total) by mouth every 8 (eight) hours as needed (Nausea or headache). (Patient not taking: Reported on 06/02/2022) 20 tablet 0   No facility-administered medications prior to visit.    PAST MEDICAL HISTORY: No past medical history on file.  PAST SURGICAL HISTORY: No past surgical history on file.  FAMILY HISTORY: Paternal uncle had a brain tumor.  SOCIAL HISTORY: Social History  Socioeconomic History   Marital status: Single    Spouse name: Not on file   Number of children: Not on file   Years of education: Not on file   Highest education level: Not on file  Occupational History   Not on file  Tobacco Use   Smoking status: Never   Smokeless tobacco: Not on file  Vaping Use   Vaping Use: Never used  Substance and Sexual Activity   Alcohol use: No   Drug use: Never    Sexual activity: Not Currently  Other Topics Concern   Not on file  Social History Narrative   Not on file   Social Determinants of Health   Financial Resource Strain: Not on file  Food Insecurity: Not on file  Transportation Needs: Not on file  Physical Activity: Not on file  Stress: Not on file  Social Connections: Not on file  Intimate Partner Violence: Not on file     PHYSICAL EXAM   GENERAL EXAM/CONSTITUTIONAL: Vitals:  There were no vitals filed for this visit.  There is no height or weight on file to calculate BMI. Wt Readings from Last 3 Encounters:  06/02/22 275 lb 6 oz (124.9 kg)  05/21/22 250 lb (113.4 kg)  07/27/14 225 lb (102.1 kg)    NEUROLOGIC: MENTAL STATUS:  awake, alert, oriented to person, place and time recent and remote memory intact normal attention and concentration language fluent, comprehension intact, naming intact fund of knowledge appropriate  CRANIAL NERVE:  2nd, 3rd, 4th, 6th - pupils equal and reactive to light, visual fields full to confrontation, extraocular muscles intact, no nystagmus 5th - facial sensation symmetric 7th - facial strength symmetric 8th - hearing intact 9th - palate elevates symmetrically, uvula midline 11th - shoulder shrug symmetric 12th - tongue protrusion midline  MOTOR:  normal bulk and tone, full strength in the BUE, BLE  SENSORY:  normal and symmetric to light touch, pinprick, temperature, vibration  COORDINATION:  finger-nose-finger, fine finger movements normal  REFLEXES:  deep tendon reflexes present and symmetric  GAIT/STATION:  normal     DIAGNOSTIC DATA (LABS, IMAGING, TESTING) - I reviewed patient records, labs, notes, testing and imaging myself where available.  Lab Results  Component Value Date   WBC 8.5 05/21/2022   HGB 16.5 05/21/2022   HCT 48.6 05/21/2022   MCV 91.5 05/21/2022   PLT 269 05/21/2022      Component Value Date/Time   NA 141 05/21/2022 2220   K 4.2  05/21/2022 2220   CL 109 05/21/2022 2220   CO2 24 05/21/2022 2220   GLUCOSE 102 (H) 05/21/2022 2220   BUN 21 (H) 05/21/2022 2220   CREATININE 1.13 05/21/2022 2220   CALCIUM 10.0 05/21/2022 2220   PROT 6.9 07/27/2014 2132   ALBUMIN 4.0 07/27/2014 2132   AST 23 07/27/2014 2132   ALT 22 07/27/2014 2132   ALKPHOS 50 07/27/2014 2132   BILITOT 0.5 07/27/2014 2132   GFRNONAA >60 05/21/2022 2220   GFRAA 86 (L) 07/27/2014 2132   CTH 05/21/22: unremarkable    ASSESSMENT AND PLAN  42 y.o. year old male with a history of migraines who presents for follow-up of daily headaches and new left sided numbness, initial consult visit with Dr. Billey Gosling 11/02/2022, who felt symptoms in setting of sensory migraine auras.  MRI brain unremarkable.  Will order MRI brain as this is a change in his previous headache pattern. 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 He prefers to avoid medications if possible. Provided supplement information for migraine and aura prevention.   No diagnosis found.   PLAN: -MRI brain  -Supplement information provided (Mg, B2, CoQ10) -next steps: consider treating for migraine if MRI normal  No orders of the defined types were placed in this encounter.   No orders of the defined types were placed in this encounter.   No follow-ups on file.    I spent *** minutes of face-to-face and non-face-to-face time with patient.  This included previsit chart review, lab review, study review, order entry, electronic health record documentation, patient education  Frann Rider, [facility]  [facility] [address] [place], [place] [address]  Phone [phone] Fax [phone] Note: This document was prepared with digital dictation and possible smart phrase technology. Any transcriptional errors that result from this process are unintentional.

## 2022-12-08 ENCOUNTER — Encounter: Payer: Self-pay | Admitting: Adult Health

## 2022-12-08 ENCOUNTER — Ambulatory Visit (INDEPENDENT_AMBULATORY_CARE_PROVIDER_SITE_OTHER): Payer: BC Managed Care – PPO | Admitting: Adult Health

## 2022-12-08 VITALS — BP 125/87 | HR 85 | Ht 75.0 in | Wt 293.8 lb

## 2022-12-08 DIAGNOSIS — Z8349 Family history of other endocrine, nutritional and metabolic diseases: Secondary | ICD-10-CM

## 2022-12-08 DIAGNOSIS — G43109 Migraine with aura, not intractable, without status migrainosus: Secondary | ICD-10-CM | POA: Diagnosis not present

## 2022-12-08 DIAGNOSIS — R519 Headache, unspecified: Secondary | ICD-10-CM | POA: Diagnosis not present

## 2022-12-08 DIAGNOSIS — R29818 Other symptoms and signs involving the nervous system: Secondary | ICD-10-CM

## 2022-12-08 DIAGNOSIS — E538 Deficiency of other specified B group vitamins: Secondary | ICD-10-CM

## 2022-12-08 MED ORDER — AMITRIPTYLINE HCL 25 MG PO TABS: 25.0000 mg | ORAL_TABLET | Freq: Every day | ORAL | 3 refills | Status: AC

## 2022-12-08 NOTE — Patient Instructions (Addendum)
Your Plan:  Recommend starting amitriptyline 25mg  nightly - if symptoms persist over the next couple weeks and we can consider increasing dosage  We will check thyroid levels and B12 levels today     Follow up in 3-4 months with NP or call earlier if needed     Thank you for coming to see [place] at [facility]. I hope we have been able to provide you high quality care today.  You may receive a patient satisfaction survey over the next few weeks. We would appreciate your feedback and comments so that we may continue to improve ourselves and the health of our patients.     Amitriptyline Tablets What is this medication? AMITRIPTYLINE (a mee TRIP ti leen) treats depression. It increases the amount of serotonin and norepinephrine in the brain, hormones that help regulate mood. It belongs to a group of medications called tricyclic antidepressants (TCAs). This medicine may be used for other purposes; ask your health care provider or pharmacist if you have questions. COMMON BRAND NAME(S): Elavil, Vanatrip What should I tell my care team before I take this medication? They need to know if you have any of these conditions: An alcohol problem Asthma, trouble breathing Bipolar disorder or schizophrenia Difficulty passing urine, prostate trouble Glaucoma Heart disease or previous heart attack Liver disease Over active thyroid Seizures Thoughts or plans of suicide, a previous suicide attempt, or family history of suicide attempt An unusual or allergic reaction to amitriptyline, other medications, foods, dyes, or preservatives Pregnant or trying to get pregnant Breast-feeding How should I use this medication? Take this medication by mouth with a drink of water. Follow the directions on the prescription label. You can take the tablets with or without food. Take your medication at regular intervals. Do not take it more often than directed. Do not stop taking this medication  suddenly except upon the advice of your care team. Stopping this medication too quickly may cause serious side effects or your condition may worsen. A special MedGuide will be given to you by the pharmacist with each prescription and refill. Be sure to read this information carefully each time. Talk to your care team regarding the use of this medication in children. Special care may be needed. Overdosage: If you think you have taken too much of this medicine contact a poison control center or emergency room at once. NOTE: This medicine is only for you. Do not share this medicine with others. What if I miss a dose? If you miss a dose, take it as soon as you can. If it is almost time for your next dose, take only that dose. Do not take double or extra doses. What may interact with this medication? Do not take this medication with any of the following: Arsenic trioxide Certain medications used to regulate abnormal heartbeat or to treat other heart conditions Cisapride Droperidol Halofantrine Linezolid MAOIs like Carbex, Eldepryl, Marplan, Nardil, and Parnate Methylene blue Other medications for mental depression Phenothiazines like perphenazine, thioridazine and chlorpromazine Pimozide Probucol Procarbazine Sparfloxacin St. John's Wort This medication may also interact with the following: Atropine and related medications like hyoscyamine, scopolamine, tolterodine and others Barbiturate medications for inducing sleep or treating seizures, like phenobarbital Cimetidine Disulfiram Ethchlorvynol Thyroid hormones such as levothyroxine Ziprasidone This list may not describe all possible interactions. Give your health care provider a list of all the medicines, herbs, non-prescription drugs, or dietary supplements you use. Also tell them if you smoke, drink alcohol, or use illegal drugs. Some  items may interact with your medicine. What should I watch for while using this medication? Tell your  care team if your symptoms do not get better or if they get worse. Visit your care team for regular checks on your progress. Because it may take several weeks to see the full effects of this medication, it is important to continue your treatment as prescribed by your care team. Patients and their families should watch out for new or worsening thoughts of suicide or depression. Also watch out for sudden changes in feelings such as feeling anxious, agitated, panicky, irritable, hostile, aggressive, impulsive, severely restless, overly excited and hyperactive, or not being able to sleep. If this happens, especially at the beginning of treatment or after a change in dose, call your care team. You may get drowsy or dizzy. Do not drive, use machinery, or do anything that needs mental alertness until you know how this medication affects you. Do not stand or sit up quickly, especially if you are an older patient. This reduces the risk of dizzy or fainting spells. Alcohol may interfere with the effect of this medication. Avoid alcoholic drinks. Do not treat yourself for coughs, colds, or allergies without asking your care team for advice. Some ingredients can increase possible side effects. Your mouth may get dry. Chewing sugarless gum or sucking hard candy, and drinking plenty of water will help. Contact your care team if the problem does not go away or is severe. This medication may cause dry eyes and blurred vision. If you wear contact lenses you may feel some discomfort. Lubricating drops may help. See your eye doctor if the problem does not go away or is severe. This medication can cause constipation. Try to have a bowel movement at least every 2 to 3 days. If you do not have a bowel movement for 3 days, call your care team. This medication can make you more sensitive to the sun. Keep out of the sun. If you cannot avoid being in the sun, wear protective clothing and use sunscreen. Do not use sun lamps or tanning  beds/booths. What side effects may I notice from receiving this medication? Side effects that you should report to your care team as soon as possible: Allergic reactions--skin rash, itching, hives, swelling of the face, lips, tongue, or throat Heart rhythm changes-- fast or irregular heartbeat, dizziness, feeling faint or lightheaded, chest pain, trouble breathing Serotonin syndrome--irritability, confusion, fast or irregular heartbeat, muscle stiffness, twitching muscles, sweating, high fever, seizure, chills, vomiting, diarrhea Sudden eye pain or change in vision such as blurry vision, seeing halos around lights, vision loss Thoughts of suicide or self-harm, worsening mood, feelings of depression Side effects that usually do not require medical attention (report to your care team if they continue or are bothersome): Change in appetite or weight Change in sex drive or performance Constipation Dizziness Drowsiness Dry mouth Tremors This list may not describe all possible side effects. Call your doctor for medical advice about side effects. You may report side effects to FDA at 1-800-FDA-1088. Where should I keep my medication? Keep out of the reach of children and pets. Store at room temperature between 20 and 25 degrees C (68 and 77 degrees F). Throw away any unused medication after the expiration date. NOTE: This sheet is a summary. It may not cover all possible information. If you have questions about this medicine, talk to your doctor, pharmacist, or health care provider.  2023 Elsevier/Gold Standard (2020-10-26 00:00:00)

## 2022-12-09 LAB — THYROID PANEL WITH TSH

## 2022-12-10 LAB — THYROID PANEL WITH TSH
Free Thyroxine Index: 2.7 (ref 1.2–4.9)
T3 Uptake Ratio: 27 % (ref 24–39)

## 2022-12-10 LAB — VITAMIN B12: Vitamin B-12: >2000 pg/mL — ABNORMAL HIGH (ref 232–1245)

## 2023-03-29 ENCOUNTER — Ambulatory Visit: Payer: BC Managed Care – PPO | Admitting: Adult Health

## 2023-06-13 NOTE — Progress Notes (Deleted)
Patient: Wesley Allen Date of Birth: 09/06/1981  Reason for Visit: Follow up History from: Patient Primary Neurologist: [facility]   ASSESSMENT AND PLAN 42 y.o. year old male    HISTORY OF PRESENT ILLNESS: Today 06/13/23  HISTORY  12/08/22 Jessica: He has been taking natural supplements since prior visit with some improvement of migraine headaches.  He continues to feel persistent head numbness/tingling which is unchanged since prior visit. See prior OV note HPI for full details. When he gets a more severe headache or migraine around once per week, these sensations intensify and will feel intense pressure sensation starting from the center of his head outwards, cold sensation in his face as well as brain fog sensation. 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 Does have difficulty sleeping at night, does need home to be cold at night. Mentions difficulty losing weight.   REVIEW OF SYSTEMS: Out of a complete 14 system review of symptoms, the patient complains only of the following symptoms, and all other reviewed systems are negative.  See HPI  ALLERGIES: No Known Allergies  HOME MEDICATIONS: Outpatient Medications Prior to Visit  Medication Sig Dispense Refill   amitriptyline (ELAVIL) 25 MG tablet Take 1 tablet (25 mg total) by mouth at bedtime. 30 tablet 3   ibuprofen (ADVIL,MOTRIN) 200 MG tablet Take 800 mg by mouth every 6 (six) hours as needed for moderate pain.     omeprazole (PRILOSEC) 20 MG capsule Take 1 capsule (20 mg total) by mouth daily. (Patient not taking: Reported on 05/21/2022) 30 capsule 0   oxyCODONE-acetaminophen (PERCOCET) 5-325 MG per tablet Take 1-2 tablets by mouth every 6 (six) hours as needed for moderate pain. (Patient not taking: Reported on 05/21/2022) 20 tablet 0   prochlorperazine (COMPAZINE) 10 MG tablet Take 1 tablet (10 mg total) by mouth every 8 (eight) hours as needed (Nausea or headache). (Patient not taking: Reported on  06/02/2022) 20 tablet 0   No facility-administered medications prior to visit.    PAST MEDICAL HISTORY: No past medical history on file.  PAST SURGICAL HISTORY: No past surgical history on file.  FAMILY HISTORY: No family history on file.  SOCIAL HISTORY: Social History   Socioeconomic History   Marital status: Single    Spouse name: Not on file   Number of children: Not on file   Years of education: Not on file   Highest education level: Not on file  Occupational History   Not on file  Tobacco Use   Smoking status: Never   Smokeless tobacco: Not on file  Vaping Use   Vaping status: Never Used  Substance and Sexual Activity   Alcohol use: No   Drug use: Never   Sexual activity: Not Currently  Other Topics Concern   Not on file  Social History Narrative   Not on file   Social Determinants of Health   Financial Resource Strain: Not on file  Food Insecurity: Not on file  Transportation Needs: Not on file  Physical Activity: Not on file  Stress: Not on file  Social Connections: Not on file  Intimate Partner Violence: Not on file    PHYSICAL EXAM  There were no vitals filed for this visit. There is no height or weight on file to calculate BMI.  Generalized: Well developed, in no acute distress  Neurological examination  Mentation: Alert oriented to time, place, history taking. Follows all commands speech and language fluent Cranial nerve II-XII: Pupils were equal  round reactive to light. Extraocular movements were full, visual field were full on confrontational test. Facial sensation and strength were normal. Uvula tongue midline. Head turning and shoulder shrug  were normal and symmetric. Motor: The motor testing reveals 5 over 5 strength of all 4 extremities. Good symmetric motor tone is noted throughout.  Sensory: Sensory testing is intact to soft touch on all 4 extremities. No evidence of extinction is noted.  Coordination: Cerebellar testing reveals good  finger-nose-finger and heel-to-shin bilaterally.  Gait and station: Gait is normal. Tandem gait is normal. Romberg is negative. No drift is seen.  Reflexes: Deep tendon reflexes are symmetric and normal bilaterally.   DIAGNOSTIC DATA (LABS, IMAGING, TESTING) - I reviewed patient records, labs, notes, testing and imaging myself where available.  Lab Results  Component Value Date   WBC 8.5 05/21/2022   HGB 16.5 05/21/2022   HCT 48.6 05/21/2022   MCV 91.5 05/21/2022   PLT 269 05/21/2022      Component Value Date/Time   NA 141 05/21/2022 2220   K 4.2 05/21/2022 2220   CL 109 05/21/2022 2220   CO2 24 05/21/2022 2220   GLUCOSE 102 (H) 05/21/2022 2220   BUN 21 (H) 05/21/2022 2220   CREATININE 1.13 05/21/2022 2220   CALCIUM 10.0 05/21/2022 2220   PROT 6.9 07/27/2014 2132   ALBUMIN 4.0 07/27/2014 2132   AST 23 07/27/2014 2132   ALT 22 07/27/2014 2132   ALKPHOS 50 07/27/2014 2132   BILITOT 0.5 07/27/2014 2132   GFRNONAA >60 05/21/2022 2220   GFRAA 86 (L) 07/27/2014 2132   No results found for: "CHOL", "HDL", "LDLCALC", "LDLDIRECT", "TRIG", "CHOLHDL" No results found for: "HGBA1C" Lab Results  Component Value Date   VITAMINB12 >2000 (H) 12/08/2022   Lab Results  Component Value Date   TSH 2.200 12/08/2022    Margie Ege, AGNP-C, DNP 06/13/2023, 3:48 PM [facility] [address] [place], [place] [address] [phone]

## 2023-06-14 ENCOUNTER — Ambulatory Visit: Payer: BC Managed Care – PPO | Admitting: Neurology
